# Patient Record
Sex: Male | Born: 1950 | Race: White | Hispanic: No | Marital: Married | Smoking: Current every day smoker
Health system: Southern US, Community
[De-identification: ages and names within clinical notes are randomized; demographics above are authoritative.]

## PROBLEM LIST (undated history)

## (undated) DIAGNOSIS — R413 Other amnesia: Secondary | ICD-10-CM

## (undated) DIAGNOSIS — E119 Type 2 diabetes mellitus without complications: Secondary | ICD-10-CM

## (undated) DIAGNOSIS — I10 Essential (primary) hypertension: Secondary | ICD-10-CM

## (undated) HISTORY — DX: Essential (primary) hypertension: I10

## (undated) HISTORY — PX: LITHOTRIPSY: SUR834

## (undated) HISTORY — DX: Other amnesia: R41.3

## (undated) HISTORY — PX: HERNIA REPAIR: SHX51

## (undated) HISTORY — DX: Type 2 diabetes mellitus without complications: E11.9

---

## 1980-07-12 HISTORY — PX: KIDNEY STONE SURGERY: SHX686

## 2000-12-14 ENCOUNTER — Ambulatory Visit (HOSPITAL_COMMUNITY): Admission: RE | Admit: 2000-12-14 | Discharge: 2000-12-14 | Payer: Self-pay | Admitting: Gastroenterology

## 2001-12-27 ENCOUNTER — Encounter: Admission: RE | Admit: 2001-12-27 | Discharge: 2002-03-27 | Payer: Self-pay | Admitting: Family Medicine

## 2003-12-19 ENCOUNTER — Ambulatory Visit (HOSPITAL_COMMUNITY): Admission: RE | Admit: 2003-12-19 | Discharge: 2003-12-19 | Payer: Self-pay | Admitting: Urology

## 2004-01-20 ENCOUNTER — Ambulatory Visit (HOSPITAL_COMMUNITY): Admission: RE | Admit: 2004-01-20 | Discharge: 2004-01-20 | Payer: Self-pay | Admitting: General Surgery

## 2004-01-28 ENCOUNTER — Ambulatory Visit (HOSPITAL_COMMUNITY): Admission: RE | Admit: 2004-01-28 | Discharge: 2004-01-28 | Payer: Self-pay | Admitting: General Surgery

## 2004-07-02 ENCOUNTER — Ambulatory Visit (HOSPITAL_COMMUNITY): Admission: RE | Admit: 2004-07-02 | Discharge: 2004-07-02 | Payer: Self-pay | Admitting: Gastroenterology

## 2014-12-31 ENCOUNTER — Other Ambulatory Visit: Payer: Self-pay | Admitting: Gastroenterology

## 2016-06-02 ENCOUNTER — Encounter: Payer: Self-pay | Admitting: Neurology

## 2016-06-02 ENCOUNTER — Ambulatory Visit (INDEPENDENT_AMBULATORY_CARE_PROVIDER_SITE_OTHER): Payer: BLUE CROSS/BLUE SHIELD | Admitting: Neurology

## 2016-06-02 VITALS — BP 136/85 | HR 80 | Ht 70.0 in | Wt 149.5 lb

## 2016-06-02 DIAGNOSIS — E538 Deficiency of other specified B group vitamins: Secondary | ICD-10-CM

## 2016-06-02 DIAGNOSIS — R413 Other amnesia: Secondary | ICD-10-CM | POA: Diagnosis not present

## 2016-06-02 DIAGNOSIS — R5382 Chronic fatigue, unspecified: Secondary | ICD-10-CM | POA: Diagnosis not present

## 2016-06-02 HISTORY — DX: Other amnesia: R41.3

## 2016-06-02 MED ORDER — DONEPEZIL HCL 5 MG PO TABS: 5.0000 mg | ORAL_TABLET | Freq: Every day | ORAL | 1 refills | Status: DC

## 2016-06-02 NOTE — Progress Notes (Signed)
Reason for visit: Memory disorder  Referring physician: Dr. Drue Noveloss  Lyle A Fodge is a 65 y.o. male  History of present illness:  Mr. Gaynelle ArabianShuman is a 65 year old left-handed white male with a history of some difficulty with memory dating back to around January 2017. The patient has had no significant progression of memory problems, but he does have some problems with fatigue that has been an issue particularly since the summer of 2017. The patient reports no excessive daytime drowsiness, but he has a generalized sense of lack of energy. He does not fall asleep if he is inactive. The patient has noted some problems with remembering names for people, and recalling recent events. He denies any difficulty recalling numbers, he is able to operate a motor vehicle without any difficulty. He denies problems keeping up with medications or appointments or doing the finances. The patient does not snore at night, he sleeps well as far as he knows. The patient denies any focal numbness or weakness of the face, arms, or legs. He denies any balance issues or difficulty controlling the bowels or the bladder. He was seen by his primary care physician, he is referred to this office for an evaluation. There has been no family history of memory issues, his parents have lived to advanced ages without dementia.  Past Medical History:  Diagnosis Date  . Diabetes (HCC)   . Hypertension   . Memory difficulty 06/02/2016    Past Surgical History:  Procedure Laterality Date  . HERNIA REPAIR  2011, 2017  . KIDNEY STONE SURGERY  1982  . LITHOTRIPSY  1991, 2010    Family History  Problem Relation Age of Onset  . Stroke Mother   . Heart attack Father     Social history:  reports that he has been smoking.  He has been smoking about 1.50 packs per day. He has never used smokeless tobacco. He reports that he does not drink alcohol or use drugs.  Medications:  Prior to Admission medications   Medication Sig Start  Date End Date Taking? Authorizing Provider  allopurinol (ZYLOPRIM) 100 MG tablet  04/26/16  Yes Historical Provider, MD  atorvastatin (LIPITOR) 40 MG tablet  03/27/16  Yes Historical Provider, MD  fenofibrate 160 MG tablet  04/26/16  Yes Historical Provider, MD  metFORMIN (GLUCOPHAGE-XR) 500 MG 24 hr tablet  04/26/16  Yes Historical Provider, MD  potassium citrate (UROCIT-K) 10 MEQ (1080 MG) SR tablet  04/26/16  Yes Historical Provider, MD  quinapril (ACCUPRIL) 40 MG tablet  04/26/16  Yes Historical Provider, MD     No Known Allergies  ROS:  Out of a complete 14 system review of symptoms, the patient complains only of the following symptoms, and all other reviewed systems are negative.  Weight loss, fatigue Birthmarks Easy bruising, easy bleeding Memory loss Decreased energy  Blood pressure 136/85, pulse 80, height 5\' 10"  (1.778 m), weight 149 lb 8 oz (67.8 kg).  Physical Exam  General: The patient is alert and cooperative at the time of the examination.  Eyes: Pupils are equal, round, and reactive to light. Discs are flat bilaterally.  Neck: The neck is supple, no carotid bruits are noted.  Respiratory: The respiratory examination is clear.  Cardiovascular: The cardiovascular examination reveals a regular rate and rhythm, no obvious murmurs or rubs are noted.  Skin: Extremities are without significant edema.  Neurologic Exam  Mental status: The patient is alert and oriented x 3 at the time of the examination.  The patient has apparent normal recent and remote memory, with an apparently normal attention span and concentration ability. MMSE done today reveals a score of 29/30.  Cranial nerves: Facial symmetry is present. There is good sensation of the face to pinprick and soft touch bilaterally. The strength of the facial muscles and the muscles to head turning and shoulder shrug are normal bilaterally. Speech is well enunciated, no aphasia or dysarthria is noted. Extraocular  movements are full. Visual fields are full. The tongue is midline, and the patient has symmetric elevation of the soft palate. No obvious hearing deficits are noted.  Motor: The motor testing reveals 5 over 5 strength of all 4 extremities. Good symmetric motor tone is noted throughout.  Sensory: Sensory testing is intact to pinprick, soft touch, vibration sensation, and position sense on all 4 extremities. No evidence of extinction is noted.  Coordination: Cerebellar testing reveals good finger-nose-finger and heel-to-shin bilaterally.  Gait and station: Gait is normal. Tandem gait is normal. Romberg is negative. No drift is seen.  Reflexes: Deep tendon reflexes are symmetric and normal bilaterally. Toes are downgoing bilaterally.   Assessment/Plan:  1. Reported memory disturbance, minimum cognitive impairment  2. Chronic fatigue  The patient reports onset of some memory problems as well as fatigue over the last one year. The patient will be set up for blood work today, he will have MRI evaluation of the brain. He is amenable to starting medication for memory, we will write a prescription for Aricept starting at 5 mg at night, he will call in one month if he is tolerating the medication, and the maintenance dose of 10 mg will be called in. The patient will follow-up in 6 months. The patient is on Lipitor, but he does not relate the initiation of this medication to his memory issues.  Marlan Palau. Keith Alvah Lagrow MD 06/02/2016 11:49 AM  [facility] [address] [place], [place] [id number]  Phone [phone] Fax [phone]

## 2016-06-03 LAB — HIV ANTIBODY (ROUTINE TESTING W REFLEX): HIV SCREEN 4TH GENERATION: NONREACTIVE

## 2016-06-03 LAB — RPR: RPR: NONREACTIVE

## 2016-06-03 LAB — TSH: TSH: 1.54 u[IU]/mL (ref 0.450–4.500)

## 2016-06-03 LAB — SEDIMENTATION RATE: Sed Rate: 3 mm/hr (ref 0–30)

## 2016-06-03 LAB — VITAMIN B12: VITAMIN B 12: 677 pg/mL (ref 211–946)

## 2016-06-07 ENCOUNTER — Telehealth: Payer: Self-pay

## 2016-06-07 NOTE — Telephone Encounter (Signed)
Called pt w/ unremarkable lab results. Verbalized understanding and appreciation for call. 

## 2016-06-07 NOTE — Telephone Encounter (Signed)
-----   Message from York Spanielharles K Willis, MD sent at 06/04/2016  7:12 AM EST -----  The blood work results are unremarkable. Please call the patient.  ----- Message ----- From: Nell RangeInterface, [facility] Lab Results In Sent: 06/03/2016   5:40 AM To: York Spanielharles K Willis, MD

## 2016-07-07 ENCOUNTER — Inpatient Hospital Stay: Admission: RE | Admit: 2016-07-07 | Payer: Self-pay | Source: Ambulatory Visit

## 2016-07-27 ENCOUNTER — Other Ambulatory Visit: Payer: Self-pay

## 2016-07-27 MED ORDER — DONEPEZIL HCL 5 MG PO TABS: 5.0000 mg | ORAL_TABLET | Freq: Every day | ORAL | 1 refills | Status: DC

## 2016-07-27 NOTE — Telephone Encounter (Signed)
90 day refill e-scribed per faxed request from pharmacy. 

## 2017-01-19 ENCOUNTER — Other Ambulatory Visit: Payer: Self-pay | Admitting: Neurology

## 2017-01-19 NOTE — Telephone Encounter (Signed)
Called and LVM for pt to call and discuss med refill for donepezil.   Also need to schedule f/u appt. He was last seen in November and Dr Anne HahnWillis wanted him to f/u in 6 months, which would have been in May. Can offer appt on 7/23 (EMG slot) if he calls.

## 2017-02-17 ENCOUNTER — Telehealth: Payer: Self-pay | Admitting: Neurology

## 2017-02-17 ENCOUNTER — Ambulatory Visit
Admission: RE | Admit: 2017-02-17 | Discharge: 2017-02-17 | Disposition: A | Discharge status: Home / Self Care | Payer: BLUE CROSS/BLUE SHIELD | Source: Ambulatory Visit | Attending: Neurology | Admitting: Neurology

## 2017-02-17 DIAGNOSIS — R413 Other amnesia: Secondary | ICD-10-CM

## 2017-02-17 MED ORDER — DONEPEZIL HCL 10 MG PO TABS: 10.0000 mg | ORAL_TABLET | Freq: Every day | ORAL | 3 refills | Status: AC

## 2017-02-17 NOTE — Telephone Encounter (Signed)
I called the patient. The MRI the brain is unremarkable, we will increase the Aricept taking 10 mg at night. He is tolerating the 5 mg dose.   MRI brain 02/17/17:  IMPRESSION:  This MRI of the brain without contrast shows the following: 1.     There is minimal chronic microvascular ischemic change and mild cortical atrophy. 2.     There are no acute findings.

## 2017-08-25 ENCOUNTER — Telehealth: Payer: Self-pay | Admitting: Neurology

## 2017-08-25 ENCOUNTER — Institutional Professional Consult (permissible substitution): Payer: BLUE CROSS/BLUE SHIELD | Admitting: Neurology

## 2017-08-25 NOTE — Telephone Encounter (Signed)
This patient did not show for revisit appointment today. 

## 2017-08-26 ENCOUNTER — Encounter: Payer: Self-pay | Admitting: Neurology

## 2017-10-14 ENCOUNTER — Other Ambulatory Visit: Payer: Self-pay | Admitting: Acute Care

## 2017-10-14 DIAGNOSIS — F17210 Nicotine dependence, cigarettes, uncomplicated: Principal | ICD-10-CM

## 2017-10-14 DIAGNOSIS — Z122 Encounter for screening for malignant neoplasm of respiratory organs: Secondary | ICD-10-CM

## 2017-10-18 ENCOUNTER — Telehealth: Payer: Self-pay | Admitting: Acute Care

## 2017-10-19 NOTE — Telephone Encounter (Signed)
Pt is calling about his appt, for 4/15 he said it was supposed to be 4/11 at 8am for his share decision visit. [phone]

## 2017-10-19 NOTE — Telephone Encounter (Signed)
Spoke with pt. He is very upset that his shared decision making visit is not scheduled on 10/20/17 at 8am. States that PierzDenise told him to come in on this day and time. Advised him that we do start seeing pt's until 9am every day. I tried to reschedule him to Friday for his SDMV and CT but he declined and wants everything canceled at this time. I have canceled his SDMV and I contacted Stacy in [place] and she will cancel the CT. Nothing further was needed at this time.

## 2017-10-19 NOTE — Telephone Encounter (Signed)
Per pt's appts, pt has a shared decision visit scheduled with Kandice RobinsonsSarah Groce 10/24/17 at 2pm.  Called pt to tell him this information and stated to him this was probably the reason someone had called him.  Pt stated to me [facility], 10/18/17 and was told by Angelique Blonderenise to come in tomorrow morning, 10/20/17 at 8am.  Since Angelique BlonderDenise is not at the office today, am going to route this to Kandice RobinsonsSarah Groce to see if she might know what pt was talking about and if pt does need to come to the office tomorrow or if he is still needing to come to the office Monday, 4/15.  Sarah, please advise. Thanks!

## 2017-10-24 ENCOUNTER — Inpatient Hospital Stay: Admission: RE | Admit: 2017-10-24 | Payer: BLUE CROSS/BLUE SHIELD | Source: Ambulatory Visit

## 2017-10-24 ENCOUNTER — Encounter: Payer: BLUE CROSS/BLUE SHIELD | Admitting: Acute Care

## 2017-10-24 ENCOUNTER — Other Ambulatory Visit: Payer: Self-pay

## 2017-10-24 ENCOUNTER — Telehealth: Payer: Self-pay | Admitting: *Deleted

## 2017-10-24 ENCOUNTER — Encounter: Payer: Self-pay | Admitting: Neurology

## 2017-10-24 ENCOUNTER — Ambulatory Visit (INDEPENDENT_AMBULATORY_CARE_PROVIDER_SITE_OTHER): Payer: Medicare Other | Admitting: Neurology

## 2017-10-24 VITALS — BP 128/84 | HR 72 | Ht 69.0 in | Wt 146.0 lb

## 2017-10-24 DIAGNOSIS — R413 Other amnesia: Secondary | ICD-10-CM

## 2017-10-24 MED ORDER — MEMANTINE HCL 5 MG PO TABS: ORAL_TABLET | ORAL | 0 refills | Status: DC

## 2017-10-24 NOTE — Progress Notes (Signed)
Faxed printed/signed rx memantine 5mg  tab to [facility] [place] at [phone]. Received fax confirmation.

## 2017-10-24 NOTE — Telephone Encounter (Signed)
I called pt. He is agreeable to use goodrx coupon and get from [facility]. Advised I will send rx/coupon to [facility] for him and he can call me back if she has further questions/concerns.   [facility]ed rx to [facility] with goodrx coupon at [phone]. Received fax confirmation.   [facility] [place], [place] to [place]/c rx. Sent rx to different pharmacy. Fax: [phone]. Received fax confirmation.

## 2017-10-24 NOTE — Progress Notes (Signed)
Reason for visit: Memory disturbance  William Herman is an 67 y.o. male  History of present illness:  William Herman is a 67 year old left-handed white male with a history of diabetes and hypertension and history of a mild memory disturbance.  The patient was seen in 2017 with complaints of problems with memory that began in January of that year.  He believes that this has gradually worsened over time, he has already undergone MRI of the brain and blood work.  The patient has been placed on Aricept, he remains at 10 mg at night.  He is tolerating medication for quite well, but he continues to have some decline in his memory.  He has some short-term memory issues, he still operates a motor vehicle without difficulty.  He denies issues keeping up with medications or appointments or managing finances.  He indicates that both of his parents who are in their 90s have memory problems.  He does report some occasional headaches but this is not a big problem for him, the headaches are quite rare and not severe.  Past Medical History:  Diagnosis Date  . Diabetes (HCC)   . Hypertension   . Memory difficulty 06/02/2016    Past Surgical History:  Procedure Laterality Date  . HERNIA REPAIR  2011, 2017  . KIDNEY STONE SURGERY  1982  . LITHOTRIPSY  1991, 2010    Family History  Problem Relation Age of Onset  . Stroke Mother   . Heart attack Father     Social history:  reports that he has been smoking.  He has been smoking about 1.50 packs per day. He has never used smokeless tobacco. He reports that he does not drink alcohol or use drugs.   No Known Allergies  Medications:  Prior to Admission medications   Medication Sig Start Date End Date Taking? Authorizing Provider  allopurinol (ZYLOPRIM) 100 MG tablet  04/26/16   [provider]  atorvastatin (LIPITOR) 40 MG tablet  03/27/16   [provider]  donepezil (ARICEPT) 10 MG tablet Take 1 tablet (10 mg total) by mouth at  bedtime. 02/17/17   York Spaniel, MD  fenofibrate 160 MG tablet  04/26/16   [provider]  metFORMIN (GLUCOPHAGE-XR) 500 MG 24 hr tablet  04/26/16   [provider]  potassium citrate (UROCIT-K) 10 MEQ (1080 MG) SR tablet  04/26/16   [provider]  quinapril (ACCUPRIL) 40 MG tablet  04/26/16   [provider]    ROS:  Out of a complete 14 system review of symptoms, the patient complains only of the following symptoms, and all other reviewed systems are negative.  Memory disturbance  Height 5\' 9"  (1.753 m), weight 146 lb (66.2 kg).  Physical Exam  General: The patient is alert and cooperative at the time of the examination.  Skin: No significant peripheral edema is noted.   Neurologic Exam  Mental status: The patient is alert and oriented x 3 at the time of the examination. The patient has apparent normal recent and remote memory, with an apparently normal attention span and concentration ability.  Mini-Mental status examination done today shows a total score 28/30.   Cranial nerves: Facial symmetry is present. Speech is normal, no aphasia or dysarthria is noted. Extraocular movements are full. Visual fields are full.  Motor: The patient has good strength in all 4 extremities.  Sensory examination: Soft touch sensation is symmetric on the face, arms, and legs.  Coordination: The patient  has good finger-nose-finger and heel-to-shin bilaterally.  Gait and station: The patient has a normal gait. Tandem gait is normal. Romberg is negative. No drift is seen.  Reflexes: Deep tendon reflexes are symmetric.   MRI brain 02/17/17:  IMPRESSION: This MRI of the brain without contrast shows the following: 1. There is minimal chronic microvascular ischemic change and mild cortical atrophy. 2. There are no acute findings.  * MRI scan images were reviewed online. I agree with the written report.    Assessment/Plan:  1.  Progressive  memory disturbance  The patient will be given a prescription for Namenda was added to the Aricept dosing.  He will call for a maintenance dose prescription when he finishes the 1 month prescription.  He will follow-up in 6 months.  Marlan Palau. Keith Willis MD 10/24/2017 10:20 AM  [facility] [address] [place], [place] [address]  Phone [phone] Fax [phone]

## 2017-10-24 NOTE — Telephone Encounter (Signed)
Received [facility] from [facility] [place] that pt does not have rx coverage and rx memantine going to be very expensive for him. Requesting alternative.   Goodrx might be a better option for pt if he would like to try the medication. [facility]ty [facility] tabs for 19.99. Will call the pt to discuss this option.

## 2017-10-24 NOTE — Patient Instructions (Signed)
   We will start namenda for the memory.  Watch out for dizziness.

## 2017-10-25 ENCOUNTER — Telehealth: Payer: Self-pay | Admitting: Neurology

## 2017-10-25 NOTE — Telephone Encounter (Signed)
Patient calling to speak with Dr. Anne HahnWillis and would not go into detail.

## 2017-10-25 NOTE — Telephone Encounter (Signed)
I called patient, left a message, I will call back later. 

## 2017-10-25 NOTE — Telephone Encounter (Signed)
I called the patient.  The patient wants to know if I will be a primary care physician for him, I indicated that I am not trained in that regard.

## 2017-11-02 ENCOUNTER — Telehealth: Payer: Self-pay | Admitting: Acute Care

## 2017-11-02 ENCOUNTER — Telehealth: Payer: Self-pay | Admitting: Neurology

## 2017-11-02 NOTE — Telephone Encounter (Signed)
I called the patient.  The patient confirms that he had a lot of dizziness on the Namenda, he will stop the medication and continue on the Aricept.

## 2017-11-02 NOTE — Telephone Encounter (Signed)
I called the wife.  The Namenda is being added to the Aricept for the memory.  If he is having too much problems with the dizziness he will stop the Namenda and stay on the Aricept.  They will call if they have any further questions.

## 2017-11-02 NOTE — Telephone Encounter (Signed)
Patient's wife is calling to discuss memantine (NAMENDA) 5 MG tablet and side effects.

## 2017-11-02 NOTE — Addendum Note (Signed)
Addended by: York SpanielWILLIS, Temika Sutphin K on: 11/02/2017 05:35 PM   Modules accepted: Orders

## 2017-11-02 NOTE — Telephone Encounter (Signed)
Pt called said he was told by Dr Anne HahnWillis today to stop taking the pill and take the spray. Pt continues to talk about a spray. Please call him to advise at [phone]

## 2017-11-02 NOTE — Telephone Encounter (Addendum)
I called wife back ([place] She stated she would rather speak with Dr. Anne HahnWillis directly, that she felt I would not be able to answer her questions. I advised I was happy to try and address concerns and if not, I can always send a message to Dr. Anne HahnWillis to contact her. She was agreeable. She states husband is experiencing dizziness on memantine. I did advise this can be a SE from medication. She asked "Why is he being prescribed a Dementia medication when he was not told at his visit he has Dementia". I advised per OV note, he was told he has a memory disturbance. No dx of Dementia made. This medication can be used for memory loss.  I educated that medication is to help slow down process of memory loss, it will not improve memory.  She is also wondering why an MRI was not ordered again. He had one completed last year. Wondering if he should he get a repeat MRI brain. I advised I will send message to Dr. Anne HahnWillis to advise on next steps. She verbalized understanding.  I also reminded her about next appt on 04/25/18 w/ CM,NP at 1045am. This will be his 6 month f/u.

## 2017-11-03 NOTE — Telephone Encounter (Signed)
LMTC x 1  

## 2017-11-08 NOTE — Telephone Encounter (Signed)
Spoke with pt and reschedule [facility] 11/16/17 9:30 CT will be reschedule Pt verbalized understanding Nothing further needed

## 2017-11-16 ENCOUNTER — Ambulatory Visit (INDEPENDENT_AMBULATORY_CARE_PROVIDER_SITE_OTHER)
Admission: RE | Admit: 2017-11-16 | Discharge: 2017-11-16 | Disposition: A | Discharge status: Home / Self Care | Payer: Medicare Other | Source: Ambulatory Visit | Attending: Acute Care | Admitting: Acute Care

## 2017-11-16 ENCOUNTER — Encounter: Payer: Self-pay | Admitting: Acute Care

## 2017-11-16 ENCOUNTER — Ambulatory Visit (INDEPENDENT_AMBULATORY_CARE_PROVIDER_SITE_OTHER): Payer: Medicare Other | Admitting: Acute Care

## 2017-11-16 DIAGNOSIS — F17210 Nicotine dependence, cigarettes, uncomplicated: Secondary | ICD-10-CM

## 2017-11-16 DIAGNOSIS — Z87891 Personal history of nicotine dependence: Secondary | ICD-10-CM | POA: Diagnosis not present

## 2017-11-16 DIAGNOSIS — Z122 Encounter for screening for malignant neoplasm of respiratory organs: Secondary | ICD-10-CM

## 2017-11-16 NOTE — Progress Notes (Addendum)
Shared Decision Making Visit Lung Cancer Screening Program [phone])   Eligibility:  Age 67 y.o.  Pack Years Smoking History Calculation 81  pack years (# packs/per year x # years smoked)  Recent History of coughing up blood  no  Unexplained weight loss? no ( >Than 15 pounds within the last 6 months )  Prior History Lung / other cancer no (Diagnosis within the last 5 years already requiring surveillance chest CT Scans).  Smoking Status Current Smoker  Former Smokers: Years since quit: NA  Quit Date: NA  Visit Components:  Discussion included one or more decision making aids. yes  Discussion included risk/benefits of screening. yes  Discussion included potential follow up diagnostic testing for abnormal scans. yes  Discussion included meaning and risk of over diagnosis. yes  Discussion included meaning and risk of False Positives. yes  Discussion included meaning of total radiation exposure. yes  Counseling Included:  Importance of adherence to annual lung cancer LDCT screening. yes  Impact of comorbidities on ability to participate in the program. yes  Ability and willingness to under diagnostic treatment. yes  Smoking Cessation Counseling:  Current Smokers:   Discussed importance of smoking cessation. yes  Information about tobacco cessation classes and interventions provided to patient. yes  Patient provided with "ticket" for LDCT Scan. yes  Symptomatic Patient. no  CounselingNA  Diagnosis Code: Tobacco Use Z72.0  Asymptomatic Patient yes  Counseling (Intermediate counseling: > three minutes counseling) U0454  Former Smokers:   Discussed the importance of maintaining cigarette abstinence. yes  Diagnosis Code: Personal History of Nicotine Dependence. U98.119  Information about tobacco cessation classes and interventions provided to patient. Yes  Patient provided with "ticket" for LDCT Scan. yes  Written Order for Lung Cancer Screening with LDCT  placed in Epic. Yes (CT Chest Lung Cancer Screening Low Dose W/O CM) [id number] Z12.2-Screening of respiratory organs Z87.891-Personal history of nicotine dependence  I have spent 25 minutes of face to face time with Mr. Eid discussing the risks and benefits of lung cancer screening. We viewed a power point together that explained in detail the above noted topics. We paused at intervals to allow for questions to be asked and answered to ensure understanding.We discussed that the single most powerful action that he can take to decrease his risk of developing lung cancer is to quit smoking. We discussed whether or not he is ready to commit to setting a quit date. He is not ready to set a quit date. We discussed options for tools to aid in quitting smoking including nicotine replacement therapy, non-nicotine medications, support groups, Quit Smart classes, and behavior modification. We discussed that often times setting smaller, more achievable goals, such as eliminating 1 cigarette a day for a week and then 2 cigarettes a day for a week can be helpful in slowly decreasing the number of cigarettes smoked. This allows for a sense of accomplishment as well as providing a clinical benefit. I gave him the " Be Stronger Than Your Excuses" card with contact information for community resources, classes, free nicotine replacement therapy, and access to mobile apps, text messaging, and on-line smoking cessation help. I have also given him my card and contact information in the event he needs to contact me. We discussed the time and location of the scan, and that either Abigail Miyamoto RN or I will call with the results within 24-48 hours of receiving them. I have offered him  a copy of the power point we viewed  as a  resource in the event they need reinforcement of the concepts we discussed today in the office. The patient verbalized understanding of all of  the above and had no further questions upon leaving the office.  They have my contact information in the event they have any further questions.  I spent 4 minutes counseling on smoking cessation and the health risks of continued tobacco abuse.He is currently not interested in quitting.  I explained to the patient that there has been a high incidence of coronary artery disease noted on these exams. I explained that this is a non-gated exam therefore degree or severity cannot be determined. This patient is on statin therapy. I have asked the patient to follow-up with their PCP regarding any incidental finding of coronary artery disease and management with diet or medication as their PCP  feels is clinically indicated. The patient verbalized understanding of the above and had no further questions upon completion of the visit.      Bevelyn Ngo, NP 11/16/2017 10:11 AM

## 2017-11-25 ENCOUNTER — Other Ambulatory Visit: Payer: Self-pay | Admitting: Acute Care

## 2017-11-25 DIAGNOSIS — Z122 Encounter for screening for malignant neoplasm of respiratory organs: Secondary | ICD-10-CM

## 2017-11-25 DIAGNOSIS — F17210 Nicotine dependence, cigarettes, uncomplicated: Principal | ICD-10-CM

## 2017-12-07 DIAGNOSIS — Z7984 Long term (current) use of oral hypoglycemic drugs: Secondary | ICD-10-CM | POA: Diagnosis not present

## 2017-12-07 DIAGNOSIS — I10 Essential (primary) hypertension: Secondary | ICD-10-CM | POA: Diagnosis not present

## 2017-12-07 DIAGNOSIS — J439 Emphysema, unspecified: Secondary | ICD-10-CM | POA: Diagnosis not present

## 2017-12-07 DIAGNOSIS — E1121 Type 2 diabetes mellitus with diabetic nephropathy: Secondary | ICD-10-CM | POA: Diagnosis not present

## 2017-12-27 ENCOUNTER — Other Ambulatory Visit: Payer: Self-pay | Admitting: Family Medicine

## 2017-12-27 DIAGNOSIS — F0280 Dementia in other diseases classified elsewhere without behavioral disturbance: Secondary | ICD-10-CM

## 2017-12-27 DIAGNOSIS — G309 Alzheimer's disease, unspecified: Principal | ICD-10-CM

## 2018-01-02 DIAGNOSIS — R413 Other amnesia: Secondary | ICD-10-CM | POA: Diagnosis not present

## 2018-01-03 ENCOUNTER — Ambulatory Visit
Admission: RE | Admit: 2018-01-03 | Discharge: 2018-01-03 | Disposition: A | Discharge status: Home / Self Care | Payer: Medicare Other | Source: Ambulatory Visit | Attending: Family Medicine | Admitting: Family Medicine

## 2018-01-03 DIAGNOSIS — F0280 Dementia in other diseases classified elsewhere without behavioral disturbance: Secondary | ICD-10-CM

## 2018-01-03 DIAGNOSIS — R410 Disorientation, unspecified: Secondary | ICD-10-CM | POA: Diagnosis not present

## 2018-01-03 DIAGNOSIS — G309 Alzheimer's disease, unspecified: Principal | ICD-10-CM

## 2018-01-03 DIAGNOSIS — R413 Other amnesia: Secondary | ICD-10-CM | POA: Diagnosis not present

## 2018-03-29 ENCOUNTER — Encounter: Payer: Self-pay | Admitting: Psychology

## 2018-04-25 ENCOUNTER — Ambulatory Visit: Payer: Medicare Other | Admitting: Nurse Practitioner

## 2018-10-02 IMAGING — MR MR HEAD W/O CM
11 series · 48 of 48 positions shown · non-contrast
Comparison: 02/17/2017

CLINICAL DATA: Memory loss and confusion that is worsening over
time. Assess for dementia.

EXAM:
MRI HEAD WITHOUT CONTRAST
TECHNIQUE: Multiplanar, multiecho pulse sequences of the brain and surrounding
structures were obtained without intravenous contrast.

[Series 2: T1 · sagittal · 5.0mm · 0.47mm/px · 2 of 27 slices shown]
[im 1/27]
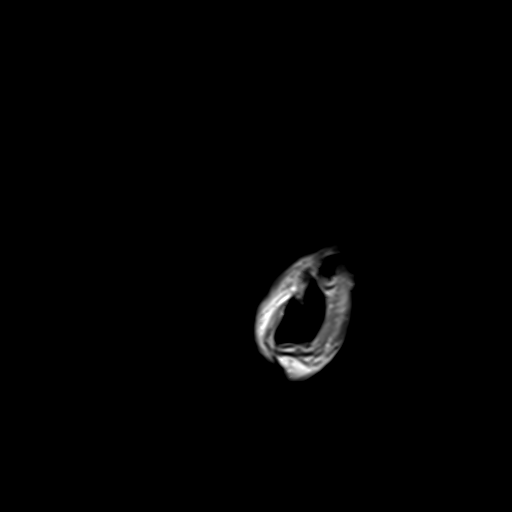
[im 27/27]
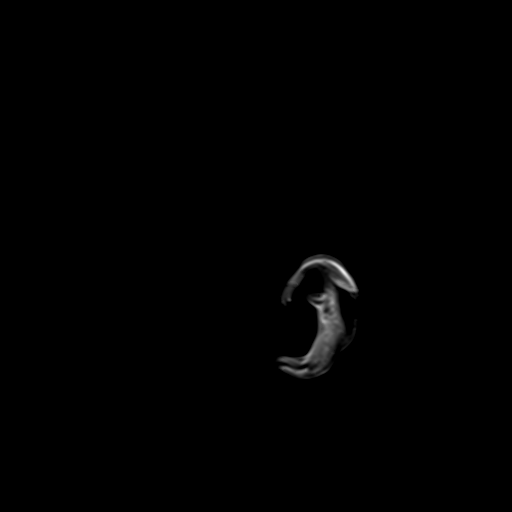

[Series 3: T2 · coronal · 5.0mm · 0.45mm/px · 2 of 30 slices shown (1 of 2)]
[im 1/30]
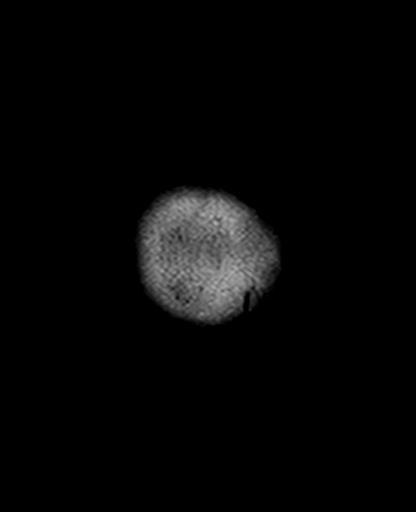
[im 30/30]
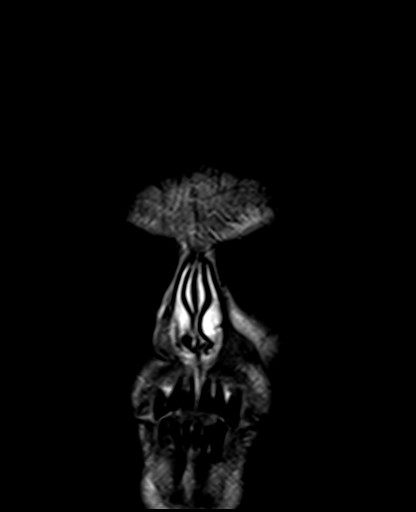

[Series 4: DWI · axial · 3.0mm · 1.88mm/px · z∈[-56,+108]mm · 9 of 113 slices shown (1 of 4)]
[im 1/113]
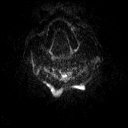
[im 15/113]
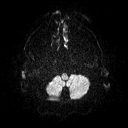
[im 29/113]
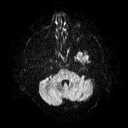
[im 43/113]
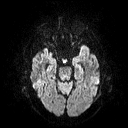
[im 57/113]
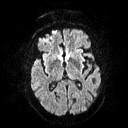
[im 71/113]
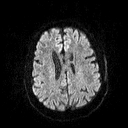
[im 85/113]
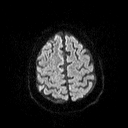
[im 99/113]
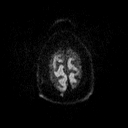
[im 113/113]
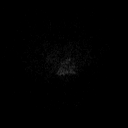

[Series 5: DWI · axial · 3.0mm · 1.88mm/px · z∈[-56,+108]mm · 4 of 55 slices shown (2 of 4)]
[im 1/55]
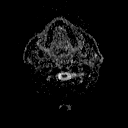
[im 19/55]
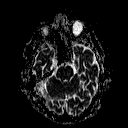
[im 37/55]
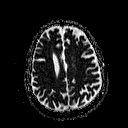
[im 55/55]
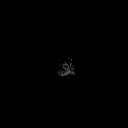

[Series 6: FLAIR · axial · 3.0mm · 0.47mm/px · z∈[-54,+105]mm · 3 of 36 slices shown]
[im 1/36]
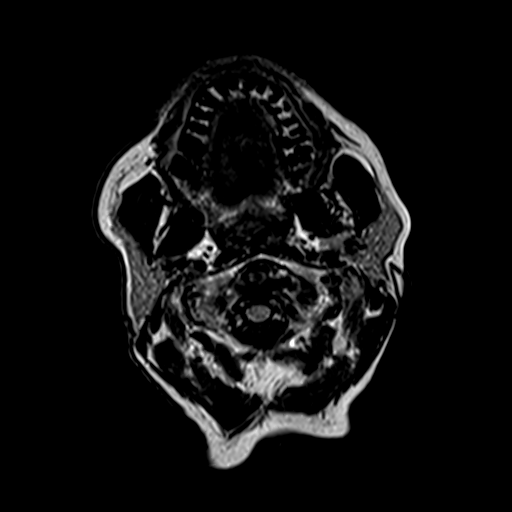
[im 18/36]
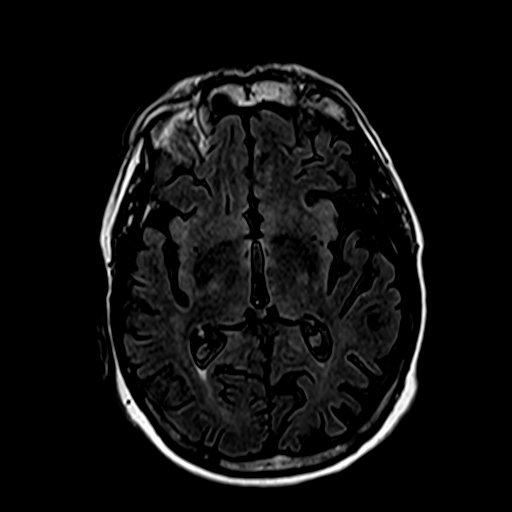
[im 36/36]
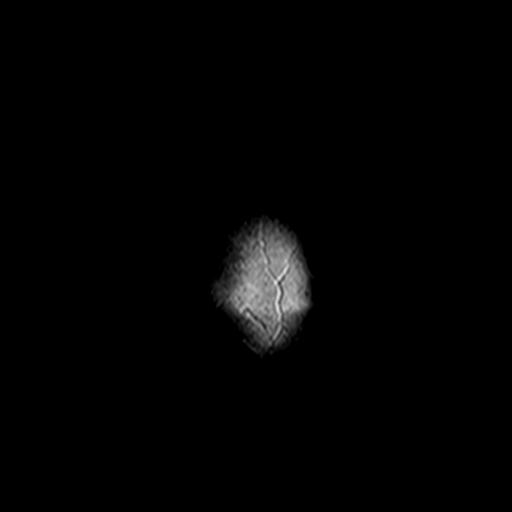

[Series 7: T2 · axial · 5.0mm · 0.62mm/px · z∈[-53,+105]mm · 2 of 25 slices shown (2 of 2)]
[im 1/25]
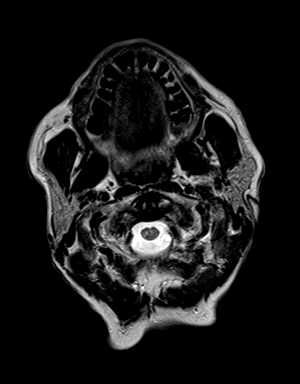
[im 25/25]
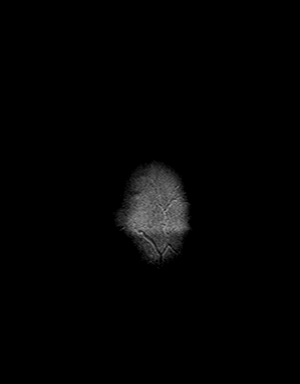

[Series 9: swi_images · axial · 5.0mm · 0.94mm/px · z∈[-60,+111]mm · 3 of 36 slices shown]
[im 1/36]
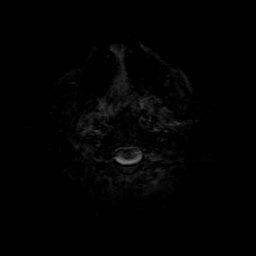
[im 18/36]
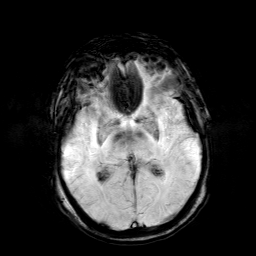
[im 36/36]
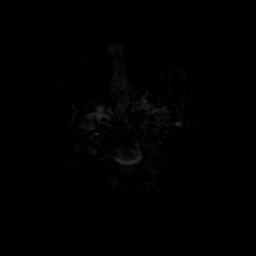

[Series 10: axial grad (blood) · axial · 5.0mm · 0.45mm/px · z∈[-58,+108]mm · 2 of 27 slices shown]
[im 1/27]
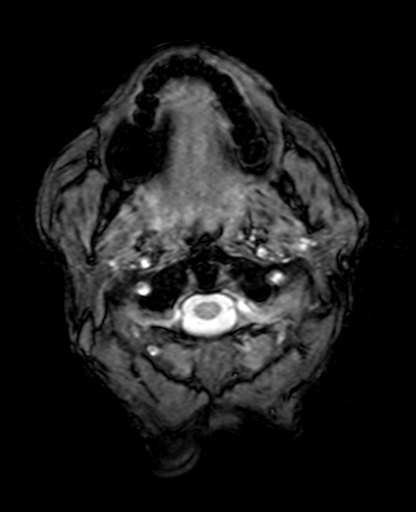
[im 27/27]
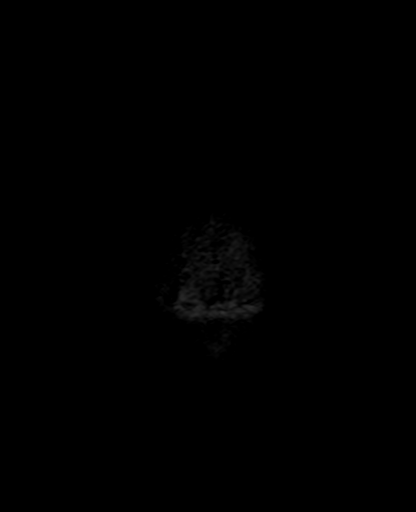

[Series 11: t1_mpr_tra · axial · 1.0mm · 0.75mm/px · z∈[-52,+104]mm · 12 of 160 slices shown]
[im 1/160]
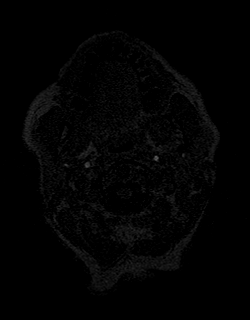
[im 15/160]
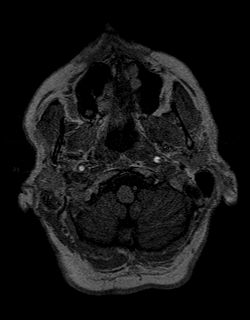
[im 29/160]
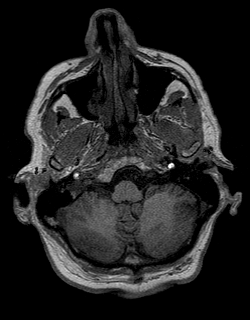
[im 44/160]
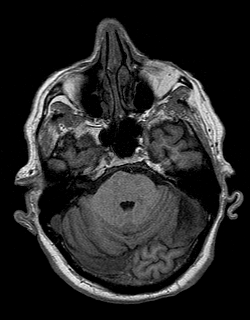
[im 58/160]
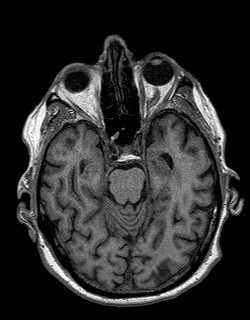
[im 73/160]
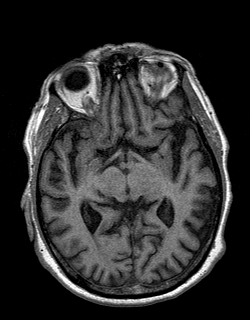
[im 87/160]
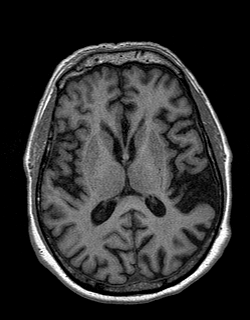
[im 102/160]
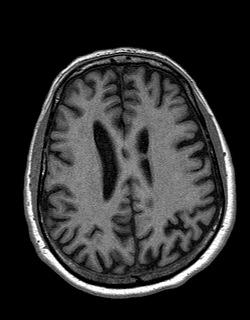
[im 116/160]
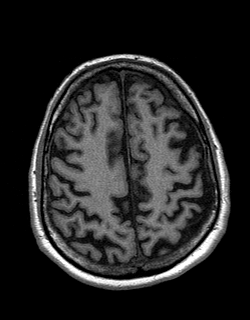
[im 131/160]
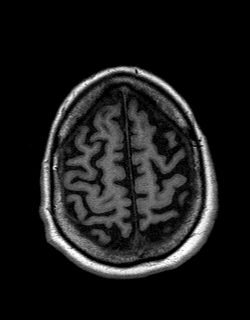
[im 145/160]
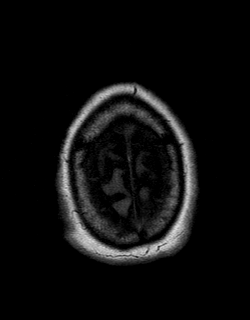
[im 160/160]
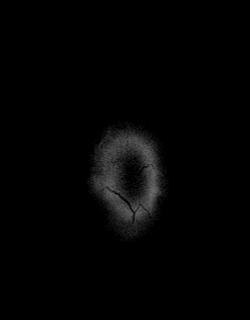

[Series 12: DWI · coronal · 5.0mm · 1.80mm/px · 6 of 78 slices shown (3 of 4)]
[im 1/78]
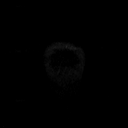
[im 16/78]
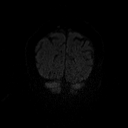
[im 31/78]
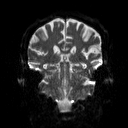
[im 47/78]
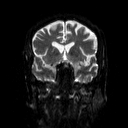
[im 62/78]
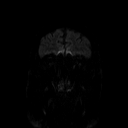
[im 78/78]
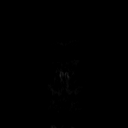

[Series 13: DWI · coronal · 5.0mm · 1.80mm/px · 3 of 39 slices shown (4 of 4)]
[im 1/39]
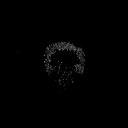
[im 20/39]
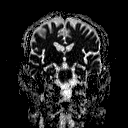
[im 39/39]
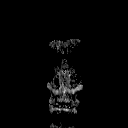

[48 of 48 positions shown; findings below may reference images not displayed]

FINDINGS: Brain: Diffusion imaging does not show any acute or subacute
infarction. The brainstem and cerebellum are normal. Cerebral
hemispheres show moderate generalized atrophy. The brain does not
show a pattern of small-vessel disease. No evidence of old large
vessel territory infarction. No mass lesion, hemorrhage,
hydrocephalus or extra-axial collection.

Vascular: Major vessels at the base of the brain show flow.

Skull and upper cervical spine: Negative

Sinuses/Orbits: Clear/normal

Other: None
IMPRESSION: No acute, focal or reversible process. Moderate generalized atrophy.

## 2018-10-05 ENCOUNTER — Encounter: Payer: Medicare Other | Admitting: Psychology

## 2019-01-02 ENCOUNTER — Other Ambulatory Visit: Payer: Self-pay | Admitting: *Deleted

## 2019-01-02 DIAGNOSIS — Z122 Encounter for screening for malignant neoplasm of respiratory organs: Secondary | ICD-10-CM

## 2019-01-02 DIAGNOSIS — Z87891 Personal history of nicotine dependence: Secondary | ICD-10-CM

## 2019-01-02 DIAGNOSIS — F17210 Nicotine dependence, cigarettes, uncomplicated: Secondary | ICD-10-CM

## 2019-01-30 ENCOUNTER — Telehealth: Payer: Self-pay | Admitting: *Deleted

## 2019-01-30 NOTE — Telephone Encounter (Deleted)

## 2019-01-30 NOTE — Telephone Encounter (Signed)
Patient called and refused to have CT done Judson Roch made aware.

## 2019-02-02 ENCOUNTER — Inpatient Hospital Stay: Admission: RE | Admit: 2019-02-02 | Payer: Medicare Other | Source: Ambulatory Visit

## 2019-02-19 ENCOUNTER — Telehealth: Payer: Self-pay | Admitting: Acute Care

## 2019-02-19 NOTE — Telephone Encounter (Signed)
Pt has refused to proceed with lung cancer screening CT for this year.  See below:  Snead, Deatra Ina, NT  Christie Beckers, RN  Cc: Magdalen Spatz, NP; Ronalee Belts S        Pt called and refused to have CT LCS done. I explained this is his yearly FU scan pt stated there is nothing wrong with his lungs and he does not want a CT.    Note sent to Dr Harrington Challenger to let him know that pt has refused to be followed in the lung screening program and referral has been cancelled.

## 2019-08-29 NOTE — Telephone Encounter (Signed)
Opened in Error.

## 2020-03-11 ENCOUNTER — Emergency Department (HOSPITAL_COMMUNITY): Payer: Medicare Other

## 2020-03-11 ENCOUNTER — Emergency Department (HOSPITAL_COMMUNITY)
Admission: EM | Admit: 2020-03-11 | Discharge: 2020-03-12 | Disposition: E | Payer: Medicare Other | Attending: Emergency Medicine | Admitting: Emergency Medicine

## 2020-03-11 ENCOUNTER — Encounter (HOSPITAL_COMMUNITY): Payer: Self-pay | Admitting: Emergency Medicine

## 2020-03-11 ENCOUNTER — Other Ambulatory Visit: Payer: Self-pay

## 2020-03-11 DIAGNOSIS — F17200 Nicotine dependence, unspecified, uncomplicated: Secondary | ICD-10-CM | POA: Diagnosis not present

## 2020-03-11 DIAGNOSIS — Z7984 Long term (current) use of oral hypoglycemic drugs: Secondary | ICD-10-CM | POA: Insufficient documentation

## 2020-03-11 DIAGNOSIS — E119 Type 2 diabetes mellitus without complications: Secondary | ICD-10-CM | POA: Diagnosis not present

## 2020-03-11 DIAGNOSIS — R579 Shock, unspecified: Secondary | ICD-10-CM | POA: Diagnosis not present

## 2020-03-11 DIAGNOSIS — Z20822 Contact with and (suspected) exposure to covid-19: Secondary | ICD-10-CM | POA: Insufficient documentation

## 2020-03-11 DIAGNOSIS — Z79899 Other long term (current) drug therapy: Secondary | ICD-10-CM | POA: Insufficient documentation

## 2020-03-11 DIAGNOSIS — I959 Hypotension, unspecified: Secondary | ICD-10-CM | POA: Diagnosis present

## 2020-03-11 DIAGNOSIS — I10 Essential (primary) hypertension: Secondary | ICD-10-CM | POA: Diagnosis not present

## 2020-03-11 LAB — CBC WITH DIFFERENTIAL/PLATELET
Abs Immature Granulocytes: 0.04 10*3/uL (ref 0.00–0.07)
Basophils Absolute: 0.1 10*3/uL (ref 0.0–0.1)
Basophils Relative: 1 %
Eosinophils Absolute: 0.1 10*3/uL (ref 0.0–0.5)
Eosinophils Relative: 1 %
HCT: 49.6 % (ref 39.0–52.0)
Hemoglobin: 15.3 g/dL (ref 13.0–17.0)
Immature Granulocytes: 0 %
Lymphocytes Relative: 23 %
Lymphs Abs: 2.4 10*3/uL (ref 0.7–4.0)
MCH: 28.7 pg (ref 26.0–34.0)
MCHC: 30.8 g/dL (ref 30.0–36.0)
MCV: 92.9 fL (ref 80.0–100.0)
Monocytes Absolute: 0.7 10*3/uL (ref 0.1–1.0)
Monocytes Relative: 7 %
Neutro Abs: 7 10*3/uL (ref 1.7–7.7)
Neutrophils Relative %: 68 %
Platelets: 203 10*3/uL (ref 150–400)
RBC: 5.34 MIL/uL (ref 4.22–5.81)
RDW: 13.2 % (ref 11.5–15.5)
WBC: 10.3 10*3/uL (ref 4.0–10.5)
nRBC: 0 % (ref 0.0–0.2)

## 2020-03-11 LAB — COMPREHENSIVE METABOLIC PANEL
ALT: 64 U/L — ABNORMAL HIGH (ref 0–44)
AST: 76 U/L — ABNORMAL HIGH (ref 15–41)
Albumin: 3 g/dL — ABNORMAL LOW (ref 3.5–5.0)
Alkaline Phosphatase: 38 U/L (ref 38–126)
Anion gap: 15 (ref 5–15)
BUN: 21 mg/dL (ref 8–23)
CO2: 16 mmol/L — ABNORMAL LOW (ref 22–32)
Calcium: 8.2 mg/dL — ABNORMAL LOW (ref 8.9–10.3)
Chloride: 107 mmol/L (ref 98–111)
Creatinine, Ser: 2.07 mg/dL — ABNORMAL HIGH (ref 0.61–1.24)
GFR calc Af Amer: 37 mL/min — ABNORMAL LOW (ref >60)
GFR calc non Af Amer: 32 mL/min — ABNORMAL LOW (ref >60)
Glucose, Bld: 288 mg/dL — ABNORMAL HIGH (ref 70–99)
Potassium: 4.6 mmol/L (ref 3.5–5.1)
Sodium: 138 mmol/L (ref 135–145)
Total Bilirubin: 1.3 mg/dL — ABNORMAL HIGH (ref 0.3–1.2)
Total Protein: 5.3 g/dL — ABNORMAL LOW (ref 6.5–8.1)

## 2020-03-11 LAB — TYPE AND SCREEN
ABO/RH(D): A POS
Antibody Screen: NEGATIVE

## 2020-03-11 LAB — SARS CORONAVIRUS 2 BY RT PCR (HOSPITAL ORDER, PERFORMED IN ~~LOC~~ HOSPITAL LAB): SARS Coronavirus 2: NEGATIVE

## 2020-03-11 LAB — I-STAT CHEM 8, ED
BUN: 28 mg/dL — ABNORMAL HIGH (ref 8–23)
Calcium, Ion: 0.96 mmol/L — ABNORMAL LOW (ref 1.15–1.40)
Chloride: 105 mmol/L (ref 98–111)
Creatinine, Ser: 1.8 mg/dL — ABNORMAL HIGH (ref 0.61–1.24)
Glucose, Bld: 271 mg/dL — ABNORMAL HIGH (ref 70–99)
HCT: 46 % (ref 39.0–52.0)
Hemoglobin: 15.6 g/dL (ref 13.0–17.0)
Potassium: 4.4 mmol/L (ref 3.5–5.1)
Sodium: 136 mmol/L (ref 135–145)
TCO2: 17 mmol/L — ABNORMAL LOW (ref 22–32)

## 2020-03-11 LAB — PROTIME-INR
INR: 1.2 (ref 0.8–1.2)
Prothrombin Time: 14.8 seconds (ref 11.4–15.2)

## 2020-03-11 LAB — TROPONIN I (HIGH SENSITIVITY): Troponin I (High Sensitivity): 506 ng/L (ref <18)

## 2020-03-11 LAB — LACTIC ACID, PLASMA: Lactic Acid, Venous: 6.4 mmol/L (ref 0.5–1.9)

## 2020-03-11 LAB — APTT: aPTT: 27 seconds (ref 24–36)

## 2020-03-11 MED ORDER — EPINEPHRINE 1 MG/10ML IJ SOSY
PREFILLED_SYRINGE | INTRAMUSCULAR | Status: AC | PRN
  Administered 2020-03-11 (×8): 1 mg via INTRAVENOUS

## 2020-03-11 MED ORDER — SODIUM BICARBONATE 8.4 % IV SOLN
INTRAVENOUS | Status: AC | PRN
  Administered 2020-03-11 (×2): 50 meq via INTRAVENOUS

## 2020-03-11 MED ORDER — NOREPINEPHRINE 4 MG/250ML-% IV SOLN
INTRAVENOUS | Status: AC
  Filled 2020-03-11: qty 250

## 2020-03-11 MED ORDER — ROCURONIUM BROMIDE 50 MG/5ML IV SOLN
INTRAVENOUS | Status: AC | PRN
  Administered 2020-03-11: 70 mg via INTRAVENOUS

## 2020-03-11 MED ORDER — ATROPINE SULFATE 1 MG/10ML IJ SOSY
PREFILLED_SYRINGE | INTRAMUSCULAR | Status: AC
  Filled 2020-03-11: qty 10

## 2020-03-11 MED ORDER — NOREPINEPHRINE BITARTRATE 1 MG/ML IV SOLN
INTRAVENOUS | Status: AC | PRN
  Administered 2020-03-11: 20 ug/kg/min via INTRAVENOUS

## 2020-03-11 MED ORDER — EPINEPHRINE 1 MG/10ML IJ SOSY
PREFILLED_SYRINGE | INTRAMUSCULAR | Status: AC
  Filled 2020-03-11: qty 40

## 2020-03-11 MED ORDER — SODIUM CHLORIDE 0.9 % IV BOLUS (SEPSIS)
1000.0000 mL | Freq: Once | INTRAVENOUS | Status: AC
  Administered 2020-03-11: 1000 mL via INTRAVENOUS

## 2020-03-11 MED ORDER — EPINEPHRINE 0.1 MG/10ML (10 MCG/ML) SYRINGE FOR IV PUSH (FOR BLOOD PRESSURE SUPPORT)
PREFILLED_SYRINGE | INTRAVENOUS | Status: AC
  Filled 2020-03-11: qty 10

## 2020-03-11 MED ORDER — ETOMIDATE 2 MG/ML IV SOLN
INTRAVENOUS | Status: AC | PRN
  Administered 2020-03-11: 10 mg via INTRAVENOUS

## 2020-03-11 MED ORDER — ATROPINE SULFATE 1 MG/ML IJ SOLN
INTRAMUSCULAR | Status: AC | PRN
  Administered 2020-03-11: 1 mg via INTRAVENOUS

## 2020-03-12 MED FILL — Medication: Qty: 1 | Status: AC

## 2020-03-12 NOTE — ED Notes (Signed)
Medical examiner transferred to Dr. Charm Barges per his request

## 2020-03-12 NOTE — ED Notes (Signed)
Stemi doctor paged to Dr. Charm Barges per his request

## 2020-03-12 NOTE — Progress Notes (Signed)
   2020/03/27 1900  Clinical Encounter Type  Visited With Patient and family together  Visit Type ED;Death  Referral From Nurse  Consult/Referral To Chaplain  Spiritual Encounters  Spiritual Needs Grief support;Emotional  Stress Factors  Family Stress Factors Loss;Loss of control   Chaplains provided support to William Herman at [facility] bedside.  Chaplain engaged in prayer with William Herman at his side and offered the ministries of presence, listening, and grief support. Chaplain was able to provided Patient Placement Card to William Herman support person, Fayrene Fearing.    Chaplain will follow-up as needed.

## 2020-03-12 NOTE — ED Provider Notes (Signed)
[facility] EMERGENCY DEPARTMENT Provider Note   CSN: [id number] Arrival date & time: 2020-03-13  1634     History Chief Complaint  Patient presents with  . Hypotension    William Herman is a 69 y.o. male.  He is brought in by EMS for evaluation of sudden onset of nausea vomiting diaphoresis hypotension at home.  Unable to get a blood pressure.  Patient is awake and alert, uncomfortable appearing.Marland Kitchen  History of some memory impairment.  States is very short of breath.He is fairly vague on with bothering him he says something feels bad between his chest and his abdomen.  Denies any pain in his back.  Stated acutely started an hour ago or so.  He has had an EKG showing new bundle.   The history is provided by the patient and the EMS personnel. The history is limited by the condition of the patient.  Illness Location:  Chest and abd Severity:  Moderate Onset quality:  Sudden Timing:  Constant Progression:  Unchanged Chronicity:  New Context:  Nausea and vomiting, diarrhea Relieved by:  Nothing Worsened by:  Nothing Ineffective treatments:  None Associated symptoms: abdominal pain, chest pain, diarrhea, nausea, shortness of breath and vomiting   Associated symptoms: no cough, no fever, no headaches, no loss of consciousness and no sore throat        Past Medical History:  Diagnosis Date  . Diabetes (HCC)   . Hypertension   . Memory difficulty 06/02/2016    Patient Active Problem List   Diagnosis Date Noted  . Memory difficulty 06/02/2016    Past Surgical History:  Procedure Laterality Date  . HERNIA REPAIR  2011, 2017  . KIDNEY STONE SURGERY  1982  . LITHOTRIPSY  1991, 2010       Family History  Problem Relation Age of Onset  . Stroke Mother   . Heart attack Father     Social History   Tobacco Use  . Smoking status: Current Every Day Smoker    Packs/day: 1.50    Years: 54.00    Pack years: 81.00  . Smokeless tobacco: Never Used  Substance  Use Topics  . Alcohol use: No  . Drug use: No    Home Medications Prior to Admission medications   Medication Sig Start Date End Date Taking? Authorizing Provider  allopurinol (ZYLOPRIM) 100 MG tablet Take 100 mg by mouth daily.  04/26/16   [provider]  atorvastatin (LIPITOR) 40 MG tablet Take 40 mg by mouth daily at 6 PM.  03/27/16   [provider]  donepezil (ARICEPT) 10 MG tablet Take 1 tablet (10 mg total) by mouth at bedtime. 02/17/17   York Spaniel, MD  fenofibrate 160 MG tablet Take 160 mg by mouth daily.  04/26/16   [provider]  metFORMIN (GLUCOPHAGE-XR) 500 MG 24 hr tablet Take 500 mg by mouth. 2 tablets every evening with meal 04/26/16   [provider]  potassium citrate (UROCIT-K) 10 MEQ (1080 MG) SR tablet Take 10 mEq by mouth 3 (three) times daily with meals.  04/26/16   [provider]  quinapril (ACCUPRIL) 40 MG tablet Take 40 mg by mouth daily.  04/26/16   [provider]    Allergies    Teresa Coombs hcl]  Review of Systems   Review of Systems  Constitutional: Negative for fever.  HENT: Negative for sore throat.   Eyes: Negative for visual disturbance.  Respiratory: Positive for shortness of breath.  Negative for cough.   Cardiovascular: Positive for chest pain.  Gastrointestinal: Positive for abdominal pain, diarrhea, nausea and vomiting.  Genitourinary: Negative for dysuria.  Musculoskeletal: Negative for back pain.  Neurological: Negative for loss of consciousness and headaches.    Physical Exam Updated Vital Signs Pulse 83   Temp (!) 97.3 F (36.3 C) (Rectal)   Resp (!) 30   Physical Exam Vitals and nursing note reviewed.  Constitutional:      General: He is in acute distress.     Appearance: He is well-developed. He is ill-appearing and diaphoretic.  HENT:     Head: Normocephalic and atraumatic.  Eyes:     Conjunctiva/sclera: Conjunctivae normal.  Cardiovascular:     Rate and  Rhythm: Normal rate and regular rhythm.     Pulses:          Radial pulses are 0 on the right side and 0 on the left side.       Femoral pulses are 1+ on the right side and 1+ on the left side.      Popliteal pulses are 0 on the right side and 0 on the left side.       Dorsalis pedis pulses are 0 on the right side and 0 on the left side.       Posterior tibial pulses are 0 on the right side and 0 on the left side.     Heart sounds: No murmur heard.   Pulmonary:     Effort: Tachypnea, accessory muscle usage and respiratory distress present.     Breath sounds: Normal breath sounds.  Abdominal:     Palpations: Abdomen is soft. There is no mass.     Tenderness: There is no abdominal tenderness. There is no guarding or rebound.  Musculoskeletal:        General: No deformity or signs of injury. Normal range of motion.     Cervical back: Neck supple.     Right lower leg: No edema.     Left lower leg: No edema.  Skin:    Capillary Refill: Capillary refill takes more than 3 seconds.     Coloration: Skin is pale.     Comments: Mottled cool  Neurological:     General: No focal deficit present.     Mental Status: He is alert.     ED Results / Procedures / Treatments   Labs (all labs ordered are listed, but only abnormal results are displayed) Labs Reviewed  LACTIC ACID, PLASMA - Abnormal; Notable for the following components:      Result Value   Lactic Acid, Venous 6.4 (*)    All other components within normal limits  COMPREHENSIVE METABOLIC PANEL - Abnormal; Notable for the following components:   CO2 16 (*)    Glucose, Bld 288 (*)    Creatinine, Ser 2.07 (*)    Calcium 8.2 (*)    Total Protein 5.3 (*)    Albumin 3.0 (*)    AST 76 (*)    ALT 64 (*)    Total Bilirubin 1.3 (*)    GFR calc non Af Amer 32 (*)    GFR calc Af Amer 37 (*)    All other components within normal limits  I-STAT CHEM 8, ED - Abnormal; Notable for the following components:   BUN 28 (*)    Creatinine, Ser  1.80 (*)    Glucose, Bld 271 (*)    Calcium, Ion 0.96 (*)    TCO2  17 (*)    All other components within normal limits  TROPONIN I (HIGH SENSITIVITY) - Abnormal; Notable for the following components:   Troponin I (High Sensitivity) 506 (*)    All other components within normal limits  SARS CORONAVIRUS 2 BY RT PCR (HOSPITAL ORDER, PERFORMED IN [facility])  CULTURE, BLOOD (SINGLE)  URINE CULTURE  CBC WITH DIFFERENTIAL/PLATELET  PROTIME-INR  APTT  LACTIC ACID, PLASMA  URINALYSIS, ROUTINE W REFLEX MICROSCOPIC  TYPE AND SCREEN  ABO/RH  TROPONIN I (HIGH SENSITIVITY)    EKG EKG Interpretation  Date/Time:  Tuesday 2020/04/01 16:58:33 EDT Ventricular Rate:  44 PR Interval:    QRS Duration: 154 QT Interval:  501 QTC Calculation: 429 R Axis:   107 Text Interpretation: Atrial fibrillation Nonspecific intraventricular conduction delay Repol abnrm, severe global ischemia (LM/MVD) Confirmed by Meridee Score [phone]) on 04/01/20 5:58:06 PM   Radiology DG Chest Port 1 View  Result Date: April 01, 2020 CLINICAL DATA:  Possible sepsis EXAM: PORTABLE CHEST 1 VIEW COMPARISON:  06/02/2011, CT 11/16/2017 FINDINGS: Chronic elevation of the right diaphragm. Linear scarring at the right base. Minimal patchy left base opacities. Stable cardiomediastinal silhouette with aortic atherosclerosis. No pneumothorax. IMPRESSION: Minimal patchy opacities at the left base, atelectasis versus minimal infiltrate. Chronic elevation of the right diaphragm. Electronically Signed   By: Jasmine Pang M.D.   On: 2020-04-01 17:14    Procedures Ultrasound ED FAST  Date/Time: 04-01-20 5:58 PM Performed by: Terrilee Files, MD Authorized by: Terrilee Files, MD  Procedure details:    Assess for:  Intra-abdominal fluid and pericardial effusion    Technique:  Abdominal and cardiac    Images: not archived      Abdominal findings:    L kidney:  Visualized   R kidney:  Visualized   Liver:   Visualized   Bladder:  Visualized,    Hepatorenal space visualized: identified     Splenorenal space: identified   Cardiac findings:    Heart:  Visualized   Wall motion: identified     Pericardial effusion: not identified   CPR  Date/Time: 2020/04/01 5:59 PM Performed by: Terrilee Files, MD Authorized by: Terrilee Files, MD  CPR Procedure Details:    ACLS/BLS initiated by EMS: No     CPR/ACLS performed in the ED: Yes     Duration of CPR (minutes):  40   Outcome: Pt declared dead    CPR performed via ACLS guidelines under my direct supervision.  See RN documentation for details including defibrillator use, medications, doses and timing. Comments:     Patient arrived in extremis.  Had very weak central pulses.  Was mentating but saying I think I am dying.  He then proceeded to have left-sided gaze and became unresponsive.  No pulses, CPR initiated.  Patient was given full spectrum ACLS medications with brief return to ROSC.  He was intubated.  Sats remained very poor and multiple cardiac ultrasound showed poor contractility.  His wife was able to see him briefly when we had return of pulses but then he lost pulses again and CPR was initiated again.  Ultimately he was asystolic with no pulses no spontaneous respirations at 1752, time of death.  Wife and chaplain in to be there when we called it. Procedure Name: Intubation Date/Time: April 01, 2020 6:02 PM Performed by: Terrilee Files, MD Pre-anesthesia Checklist: Patient identified, Patient being monitored, Emergency Drugs available, Timeout performed and Suction available Oxygen Delivery Method: Non-rebreather mask Preoxygenation: Pre-oxygenation  with 100% oxygen Induction Type: Rapid sequence Ventilation: Mask ventilation without difficulty Laryngoscope Size: Glidescope and 3 Grade View: Grade II Tube size: 7.5 mm Number of attempts: 1 Placement Confirmation: ETT inserted through vocal cords under direct vision,  CO2 detector and  Breath sounds checked- equal and bilateral Tube secured with: ETT holder Dental Injury: Teeth and Oropharynx as per pre-operative assessment     .Critical Care Performed by: Terrilee Files, MD Authorized by: Terrilee Files, MD   Critical care provider statement:    Critical care time (minutes):  80   Critical care time was exclusive of:  Separately billable procedures and treating other patients   Critical care was necessary to treat or prevent imminent or life-threatening deterioration of the following conditions:  Circulatory failure and respiratory failure   Critical care was time spent personally by me on the following activities:  Discussions with consultants, evaluation of patient's response to treatment, examination of patient, ordering and performing treatments and interventions, ordering and review of laboratory studies, ordering and review of radiographic studies, pulse oximetry, re-evaluation of patient's condition, obtaining history from patient or surrogate, review of old charts, development of treatment plan with patient or surrogate and ventilator management   I assumed direction of critical care for this patient from another provider in my specialty: no   Ultrasound ED Echo  Date/Time: 03/12/2020 10:05 AM Performed by: Terrilee Files, MD Authorized by: Terrilee Files, MD   Procedure details:    Indications: cardiac arrest     Views: parasternal long axis view     Images: not archived   Findings:    Pericardium: no pericardial effusion     Cardiac Activity: normal cardiac activity and agonal     LV Function: severly depressed (<30%)   Impression:    Impression: abnormal cardiac activity and decreased contractility     (including critical care time)  Medications Ordered in ED Medications  sodium chloride 0.9 % bolus 1,000 mL (0 mLs Intravenous Stopped 03/30/20 1753)  EPINEPHrine (ADRENALIN) 1 MG/10ML injection (1 mg Intravenous Given 03-30-2020 1741)    atropine injection (1 mg Intravenous Given 03/30/2020 1709)  sodium bicarbonate injection (50 mEq Intravenous Given 03-30-20 1738)  rocuronium (ZEMURON) injection (70 mg Intravenous Given Mar 30, 2020 1715)  etomidate (AMIDATE) injection (10 mg Intravenous Given March 30, 2020 1716)  norepinephrine (LEVOPHED) injection ( Intravenous Stopped Mar 30, 2020 1814)    ED Course  I have reviewed the triage vital signs and the nursing notes.  Pertinent labs & imaging results that were available during my care of the patient were reviewed by me and considered in my medical decision making (see chart for details).  Clinical Course as of Mar 11 2030  Tue 03-30-2020  1813 I discussed the case with Dr. [place] on the patient's arrival to review his EKG.  He agreed that it was ischemic appearing but did not meet STEMI criteria and recommended continued work-up.   [MB]  1813 Discussed with medical examiner Randa Evens who felt the case was natural causes and is not excepting.   [MB]    Clinical Course User Index [MB] Terrilee Files, MD   MDM Rules/Calculators/A&P                         This patient complains of shortness of breath nausea vomiting diarrhea; this involves an extensive number of treatment Options and is a complaint that carries with it a high risk of complications and Morbidity.  The differential includes shock, ACS, PE, pneumothorax, dissection, arrhythmia, GI bleed  I ordered, reviewed and interpreted labs, which included CBC with normal white count normal hemoglobin, chemistries with low bicarb elevated creatinine, troponin elevated, Covid testing negative I ordered medication IV fluids for hypotension, ACLS meds during resuscitation I ordered imaging studies which included portable chest x-ray and I independently    visualized and interpreted imaging which showed elevated right hemidiaphragm no obvious infiltrates or pneumothorax Additional history obtained from EMS, patient's wife Previous records  obtained and reviewed in epic, no recent admissions I consulted Dr. [place]Jordan STEMI attending and discussed lab and imaging findings  Critical Interventions: Intubation, code, multiple ultrasounds  After the interventions stated above, I reevaluated the patient and found patient to be in asystole.  We have been coding him on and off for an hour or so.  Feel he likely had a massive PE.  Medical examiner declined the case.  Wife does not want autopsy.  Appreciate chaplain assistance in helping wife be there during the code.  Final Clinical Impression(s) / ED Diagnoses Final diagnoses:  Shock [facility](HCC)    Rx / DC Orders ED Discharge Orders    None       Terrilee FilesButler, Tanaia Hawkey C, MD 03/12/20 1011

## 2020-03-12 NOTE — ED Triage Notes (Signed)
Pt BIB [facility] from home, c/o sudden onset n/v/d and shortness of breath, pale and diaphoretic. Hypotensive with EMS 60/30

## 2020-03-12 DEATH — deceased

## 2020-12-08 IMAGING — DX DG CHEST 1V PORT
1 series · 1 of 1 positions shown · non-contrast
Comparison: 06/02/2011, CT 11/16/2017

CLINICAL DATA: Possible sepsis

EXAM:
PORTABLE CHEST 1 VIEW

[chest]
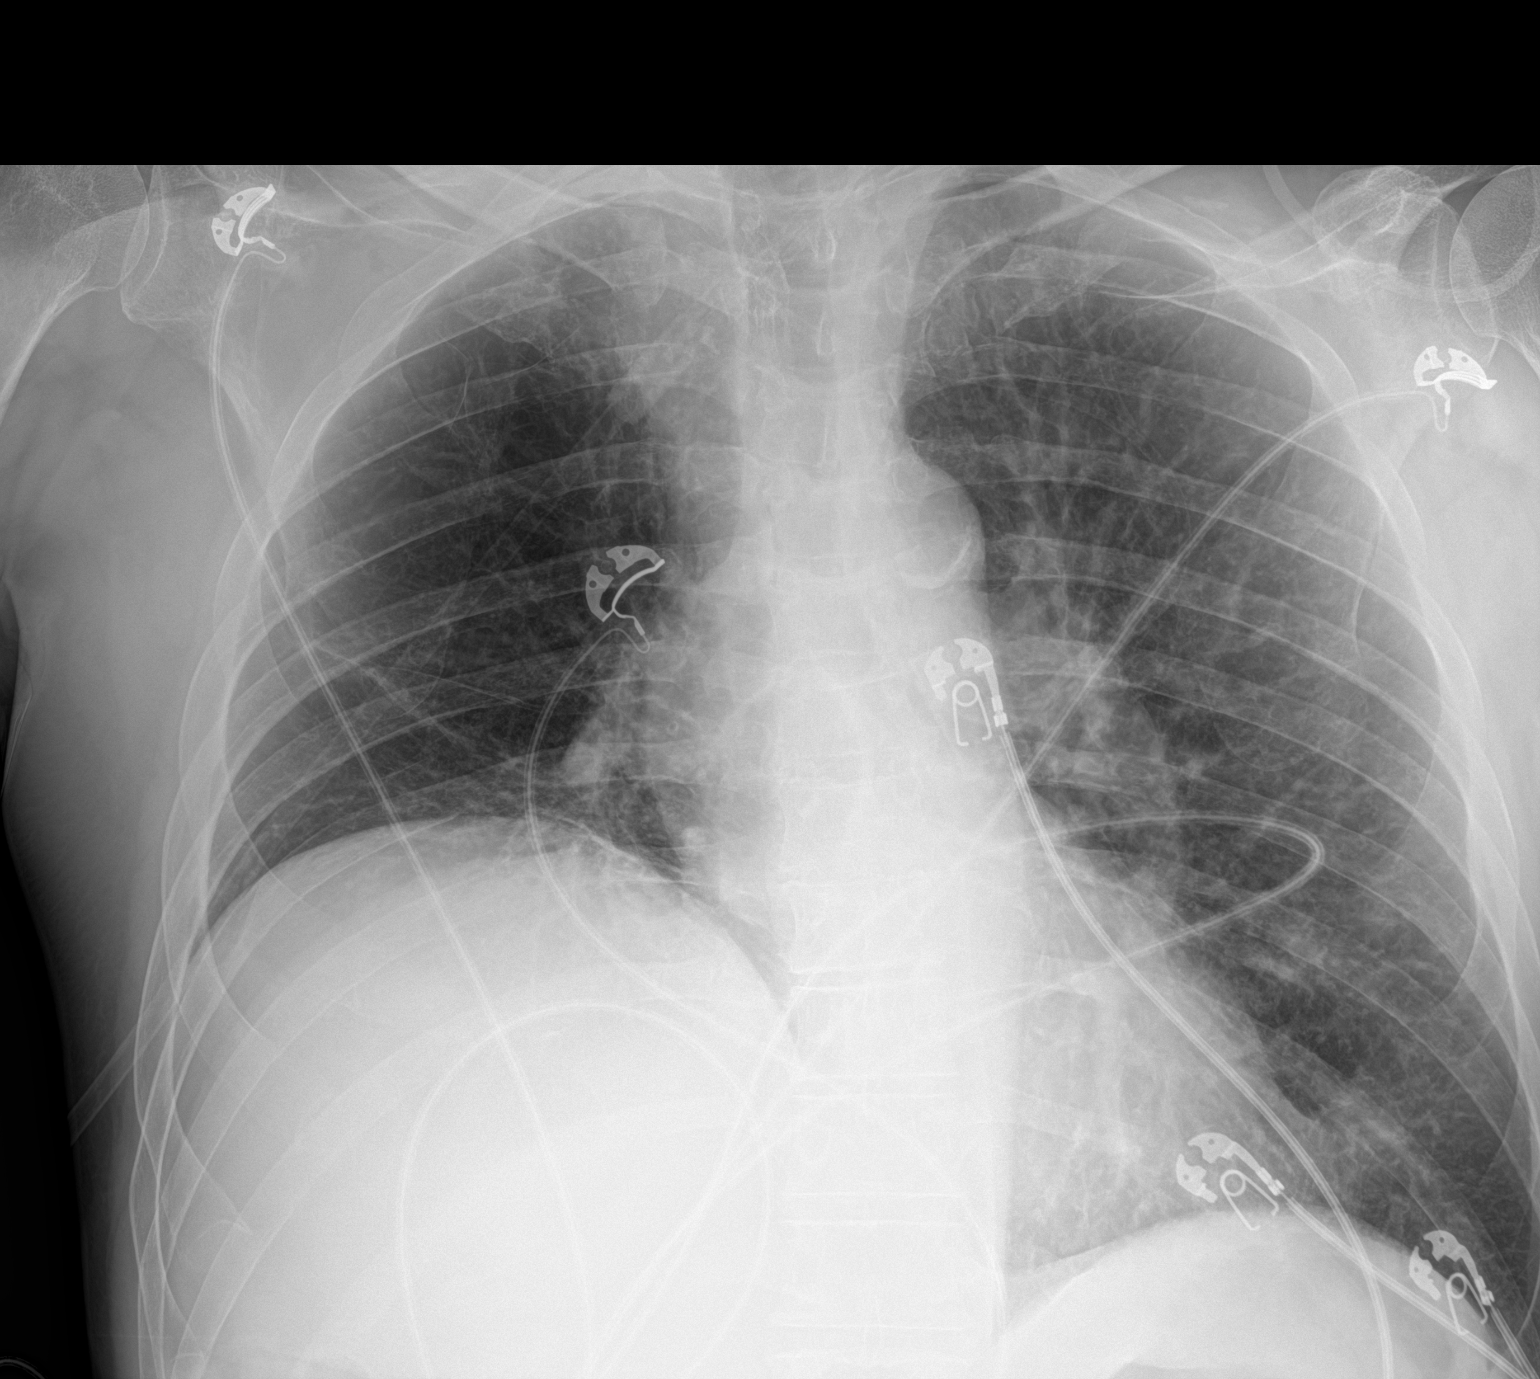

[1 of 1 positions shown; findings below may reference images not displayed]

FINDINGS: Chronic elevation of the right diaphragm. Linear scarring at the
right base. Minimal patchy left base opacities. Stable
cardiomediastinal silhouette with aortic atherosclerosis. No
pneumothorax.
IMPRESSION: Minimal patchy opacities at the left base, atelectasis versus
minimal infiltrate. Chronic elevation of the right diaphragm.
